# Patient Record
Sex: Male | Born: 2000
Health system: Southern US, Community
[De-identification: ages and names within clinical notes are randomized; demographics above are authoritative.]

## PROBLEM LIST (undated history)

## (undated) DIAGNOSIS — L700 Acne vulgaris: Secondary | ICD-10-CM

## (undated) DIAGNOSIS — T7840XA Allergy, unspecified, initial encounter: Secondary | ICD-10-CM

## (undated) DIAGNOSIS — J45909 Unspecified asthma, uncomplicated: Secondary | ICD-10-CM

## (undated) HISTORY — PX: WISDOM TOOTH EXTRACTION: SHX21

## (undated) HISTORY — DX: Allergy, unspecified, initial encounter: T78.40XA

## (undated) HISTORY — DX: Acne vulgaris: L70.0

## (undated) HISTORY — DX: Unspecified asthma, uncomplicated: J45.909

---
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
## 2023-05-05 ENCOUNTER — Telehealth: Payer: Self-pay

## 2023-05-05 NOTE — Telephone Encounter (Signed)
 Pt's mother, Cain Saupe, states she spoke to Dr. Clent Ridges who agreed to take on pt? Please advise, is it okay to sch pt as a new pt?

## 2023-05-06 NOTE — Telephone Encounter (Signed)
Yes I agreed to see him  

## 2023-05-06 NOTE — Telephone Encounter (Signed)
 Left a message on pt mom voicemail, advised to call the office and schedule a new pt appointment with Dr Clent Ridges

## 2023-05-13 NOTE — Telephone Encounter (Signed)
 Pt has been sch for 06-10-2022 9 am . Pt mom said md said he will also do cpe

## 2023-05-16 NOTE — Telephone Encounter (Signed)
 Noted.

## 2023-06-10 ENCOUNTER — Ambulatory Visit: Payer: Self-pay | Admitting: Family Medicine
